# Patient Record
Sex: Female | Born: 1960 | Race: Black or African American | Hispanic: No | Marital: Single | Smoking: Never smoker
Health system: Southern US, Community
[De-identification: ages and names within clinical notes are randomized; demographics above are authoritative.]

## PROBLEM LIST (undated history)

## (undated) DIAGNOSIS — I10 Essential (primary) hypertension: Secondary | ICD-10-CM

## (undated) HISTORY — DX: Essential (primary) hypertension: I10

## (undated) HISTORY — DX: Morbid (severe) obesity due to excess calories: E66.01

---

## 2006-12-29 ENCOUNTER — Ambulatory Visit: Payer: Self-pay | Admitting: Emergency Medicine

## 2020-02-19 ENCOUNTER — Ambulatory Visit
Admission: EM | Admit: 2020-02-19 | Discharge: 2020-02-19 | Disposition: A | Discharge status: Home / Self Care | Payer: BC Managed Care – PPO | Attending: Emergency Medicine | Admitting: Emergency Medicine

## 2020-02-19 ENCOUNTER — Other Ambulatory Visit: Payer: Self-pay

## 2020-02-19 ENCOUNTER — Ambulatory Visit (INDEPENDENT_AMBULATORY_CARE_PROVIDER_SITE_OTHER): Payer: BC Managed Care – PPO

## 2020-02-19 DIAGNOSIS — R591 Generalized enlarged lymph nodes: Secondary | ICD-10-CM

## 2020-02-19 DIAGNOSIS — Z20822 Contact with and (suspected) exposure to covid-19: Secondary | ICD-10-CM | POA: Diagnosis not present

## 2020-02-19 LAB — COMPREHENSIVE METABOLIC PANEL
ALT: 20 U/L (ref 0–44)
AST: 27 U/L (ref 15–41)
Albumin: 3.5 g/dL (ref 3.5–5.0)
Alkaline Phosphatase: 104 U/L (ref 38–126)
Anion gap: 12 (ref 5–15)
BUN: 11 mg/dL (ref 6–20)
CO2: 27 mmol/L (ref 22–32)
Calcium: 9 mg/dL (ref 8.9–10.3)
Chloride: 100 mmol/L (ref 98–111)
Creatinine, Ser: 0.74 mg/dL (ref 0.44–1.00)
GFR calc Af Amer: >60 mL/min (ref >60)
GFR calc non Af Amer: >60 mL/min (ref >60)
Glucose, Bld: 96 mg/dL (ref 70–99)
Potassium: 4.1 mmol/L (ref 3.5–5.1)
Sodium: 139 mmol/L (ref 135–145)
Total Bilirubin: 0.7 mg/dL (ref 0.3–1.2)
Total Protein: 8.3 g/dL — ABNORMAL HIGH (ref 6.5–8.1)

## 2020-02-19 LAB — CBC WITH DIFFERENTIAL/PLATELET
Abs Immature Granulocytes: 0.05 10*3/uL (ref 0.00–0.07)
Basophils Absolute: 0.1 10*3/uL (ref 0.0–0.1)
Basophils Relative: 1 %
Eosinophils Absolute: 0 10*3/uL (ref 0.0–0.5)
Eosinophils Relative: 0 %
HCT: 39.9 % (ref 36.0–46.0)
Hemoglobin: 12.6 g/dL (ref 12.0–15.0)
Immature Granulocytes: 0 %
Lymphocytes Relative: 15 %
Lymphs Abs: 1.7 10*3/uL (ref 0.7–4.0)
MCH: 29.6 pg (ref 26.0–34.0)
MCHC: 31.6 g/dL (ref 30.0–36.0)
MCV: 93.7 fL (ref 80.0–100.0)
Monocytes Absolute: 1.4 10*3/uL — ABNORMAL HIGH (ref 0.1–1.0)
Monocytes Relative: 12 %
Neutro Abs: 8.4 10*3/uL — ABNORMAL HIGH (ref 1.7–7.7)
Neutrophils Relative %: 72 %
Platelets: 342 10*3/uL (ref 150–400)
RBC: 4.26 MIL/uL (ref 3.87–5.11)
RDW: 13.2 % (ref 11.5–15.5)
WBC: 11.6 10*3/uL — ABNORMAL HIGH (ref 4.0–10.5)
nRBC: 0 % (ref 0.0–0.2)

## 2020-02-19 NOTE — ED Provider Notes (Signed)
HPI  SUBJECTIVE:  Karen Welch is a 58 y.o. female who presents with 20 days of sore throat, nausea, fatigue, generalized weakness, dry cough, decreased appetite.  Symptoms started on 7/17.  States that she has had an unintentional 20 pound weight loss during this time.  She reports tender left axillary swollen lymphadenopathy, bilateral cervical and submental lymphadenopathy.  She reports some diarrhea that has mostly resolved and altered taste.  She denies fevers, night sweats, chest pain, shortness of breath, wheezing, abdominal pain, loss of sense of smell.  No dental pain.  She denies breast pain, nipple discharge.  States that she does regular breast self exams.  She got 2 doses of the Pfizer vaccine in May.  She has been taking cold medicine, Corcedrin and Tylenol without improvement in her symptoms.  Her fatigue is worse with exertion.  No antipyretic in the past 6 hours.  No blood transfusions, IV drug use, she has not been sexually active in "years".  No recent antibiotics, travel.  She has had 2 negative Covid PCR test and 1 negative Covid antibody test that came back today.  She is concerned that she has Covid.  She has a history of hypertension.  No history of breast cancer.  Had a normal mammogram 1 year ago.  No history of diabetes, HIV, multiple myeloma, cancer, smoking.  She had a colonoscopy in 2019 that found polyps.  Family history significant for mom, dad, sister, brother with lung cancer.  She states that they were all smokers.  TDH:RCBULAG, Lattie Haw, MD   Past Medical History:  Diagnosis Date  . Hypertension   . Morbid obesity ([place])     History reviewed. No pertinent surgical history.  Family History  Problem Relation Age of Onset  . COPD Mother   . Cancer Mother   . Cancer Father     Social History   Tobacco Use  . Smoking status: Never Smoker  . Smokeless tobacco: Never Used  Vaping Use  . Vaping Use: Never used  Substance Use Topics  . Alcohol use: Yes     Comment: ocassional glass of wine  . Drug use: Not Currently    No current facility-administered medications for this encounter.  Current Outpatient Medications:  .  furosemide (LASIX) 20 MG tablet, Take 1 tablet by mouth daily., Disp: , Rfl:  .  lisinopril (ZESTRIL) 40 MG tablet, Take by mouth., Disp: , Rfl:  .  valACYclovir (VALTREX) 500 MG tablet, Take by mouth., Disp: , Rfl:  .  ascorbic acid (VITAMIN C) 500 MG tablet, Take by mouth., Disp: , Rfl:  .  aspirin 81 MG EC tablet, Take by mouth., Disp: , Rfl:  .  Biotin 5 MG CAPS, Take by mouth., Disp: , Rfl:  .  cyanocobalamin 1000 MCG tablet, Take by mouth., Disp: , Rfl:  .  Magnesium (V-R MAGNESIUM) 250 MG TABS, Take by mouth., Disp: , Rfl:  .  Multiple Vitamins-Minerals ([employer] SPECTRAVITE ADULT 50+) CHEW, Chew by mouth., Disp: , Rfl:  .  zinc gluconate ([employer] ZINC) 50 MG tablet, Take by mouth., Disp: , Rfl:   No Known Allergies   ROS  As noted in HPI.   Physical Exam  BP 137/73 (BP Location: Right Arm)   Pulse 78   Temp 99.3 F (37.4 C) (Oral)   Resp 17   Ht 5' (1.524 m)   Wt 108.9 kg   SpO2 98%   BMI 46.87 kg/m   Constitutional: Well developed, well nourished, no  acute distress Eyes: PERRL, EOMI, conjunctiva normal bilaterally HENT: Normocephalic, atraumatic,mucus membranes moist.  Normal dentition.  Normal tonsils.  Normal TMs bilaterally.  Respiratory: Clear to auscultation bilaterally, no rales, no wheezing, no rhonchi Cardiovascular: Normal rate and rhythm, no murmurs, no gallops, no rubs GI: Soft, nondistended, normal bowel sounds, nontender, no rebound, no guarding.  No appreciable splenomegaly. Back: no CVAT  Breasts: Pendulous.  Symmetric.  No palpable masses. Lymph: Positive tender anterior bilateral cervical lymphadenopathy. Positive tender submental lymphadenopathy bilaterally.  No preauricular, postauricular lymphadenopathy.  Positive tender lymph node left axilla.  No supraclavicular, inguinal  lymphadenopathy skin: No rash, skin intact Musculoskeletal: No edema, no tenderness, no deformities Neurologic: Alert & oriented x 3, CN III-XII grossly intact, no motor deficits, sensation grossly intact Psychiatric: Speech and behavior appropriate   ED Course   Medications - No data to display  Orders Placed This Encounter  Procedures  . SARS CORONAVIRUS 2 (TAT 6-24 HRS) Nasopharyngeal Nasopharyngeal Swab    Standing Status:   Standing    Number of Occurrences:   1    Order Specific Question:   Is this test for diagnosis or screening    Answer:   Diagnosis of ill patient    Order Specific Question:   Symptomatic for COVID-19 as defined by CDC    Answer:   Yes    Order Specific Question:   Date of Symptom Onset    Answer:   01/31/2020    Order Specific Question:   Hospitalized for COVID-19    Answer:   No    Order Specific Question:   Admitted to ICU for COVID-19    Answer:   No    Order Specific Question:   Previously tested for COVID-19    Answer:   Yes    Order Specific Question:   Resident in a congregate (group) care setting    Answer:   No    Order Specific Question:   Employed in healthcare setting    Answer:   No    Order Specific Question:   Pregnant    Answer:   No    Order Specific Question:   Has patient completed COVID vaccination(s) (2 doses of Pfizer/Moderna 1 dose of [employer])    Answer:   Yes  . DG Chest 2 View    Standing Status:   Standing    Number of Occurrences:   1    Order Specific Question:   Reason for Exam (SYMPTOM  OR DIAGNOSIS REQUIRED)    Answer:   Cough 20 lb wt loss swollen LN r/o malignancy. not a smoker  . CBC with Differential    Standing Status:   Standing    Number of Occurrences:   1  . Comprehensive metabolic panel    Standing Status:   Standing    Number of Occurrences:   1   Results for orders placed or performed during the hospital encounter of 02/19/20 (from the past 24 hour(s))  CBC with Differential     Status:  Abnormal   Collection Time: 02/19/20  7:58 PM  Result Value Ref Range   WBC 11.6 (H) 4.0 - 10.5 K/uL   RBC 4.26 3.87 - 5.11 MIL/uL   Hemoglobin 12.6 12.0 - 15.0 g/dL   HCT 39.9 36 - 46 %   MCV 93.7 80.0 - 100.0 fL   MCH 29.6 26.0 - 34.0 pg   MCHC 31.6 30.0 - 36.0 g/dL   RDW 13.2 11.5 -  15.5 %   Platelets 342 150 - 400 K/uL   nRBC 0.0 0.0 - 0.2 %   Neutrophils Relative % 72 %   Neutro Abs 8.4 (H) 1.7 - 7.7 K/uL   Lymphocytes Relative 15 %   Lymphs Abs 1.7 0.7 - 4.0 K/uL   Monocytes Relative 12 %   Monocytes Absolute 1.4 (H) 0 - 1 K/uL   Eosinophils Relative 0 %   Eosinophils Absolute 0.0 0 - 0 K/uL   Basophils Relative 1 %   Basophils Absolute 0.1 0 - 0 K/uL   Immature Granulocytes 0 %   Abs Immature Granulocytes 0.05 0.00 - 0.07 K/uL  Comprehensive metabolic panel     Status: Abnormal   Collection Time: 02/19/20  7:58 PM  Result Value Ref Range   Sodium 139 135 - 145 mmol/L   Potassium 4.1 3.5 - 5.1 mmol/L   Chloride 100 98 - 111 mmol/L   CO2 27 22 - 32 mmol/L   Glucose, Bld 96 70 - 99 mg/dL   BUN 11 6 - 20 mg/dL   Creatinine, Ser 0.74 0.44 - 1.00 mg/dL   Calcium 9.0 8.9 - 10.3 mg/dL   Total Protein 8.3 (H) 6.5 - 8.1 g/dL   Albumin 3.5 3.5 - 5.0 g/dL   AST 27 15 - 41 U/L   ALT 20 0 - 44 U/L   Alkaline Phosphatase 104 38 - 126 U/L   Total Bilirubin 0.7 0.3 - 1.2 mg/dL   GFR calc non Af Amer >60 >60 mL/min   GFR calc Af Amer >60 >60 mL/min   Anion gap 12 5 - 15   DG Chest 2 View  Result Date: 02/19/2020 CLINICAL DATA:  Cough and weight loss EXAM: CHEST - 2 VIEW COMPARISON:  None. FINDINGS: The heart size and mediastinal contours are within normal limits. Both lungs are clear. The visualized skeletal structures are unremarkable. IMPRESSION: No active cardiopulmonary disease. Electronically Signed   By: Ulyses Jarred M.D.   On: 02/19/2020 20:21    ED Clinical Impression  1. Lymphadenopathy   2. Encounter for laboratory testing for COVID-19 virus      ED  Assessment/Plan  Given the swollen lymph nodes and 20 pound weight loss, malignancy in the differential.  Also in the differential is a viral illness such as mono.  She has no evidence of a dental or pharyngeal infection to explain her submental and neck lymphadenopathy.   will repeat Covid test per patient request.  Obtaining chest x-ray, CBC, CMP.  Considered HIV but in the absence of risk factors, deferred this today.  Reviewed imaging independently.  Normal chest x-ray. See radiology report for full details.  Mild leukocytosis.  Normal chest x-ray.  Normal CMP.  Unsure as to the cause of her symptoms but given the tender lymphadenopathy favor viral cause. will have her follow-up with her primary care if this lymphadenopathy does not resolve in a week or so.  May need to be evaluated by heme-onc for LN biopsy.  Discussed labs, imaging, MDM, treatment plan, and plan for follow-up with patient Discussed sn/sx that should prompt return to the ED. patient agrees with plan.   No orders of the defined types were placed in this encounter.   *This clinic note was created using Dragon dictation software. Therefore, there may be occasional mistakes despite careful proofreading.  ?    Melynda Ripple, MD 02/20/20 1344

## 2020-02-19 NOTE — Discharge Instructions (Addendum)
Your labs were significant for mildly elevated white count.  Everything else including your chest x-ray was normal.  I suspect that this may be a viral cause.  We will have your Covid test results back in 24 hours.  We will contact you only if they are positive.  Follow-up with your doctor if you are not better in 10 days.  You may need to be seen by heme-onc.  In the meantime, may take Tylenol/ibuprofen together 3-4 times a day as needed for pain.

## 2020-02-19 NOTE — ED Triage Notes (Addendum)
Pt states she may have been exposed to COVID at a car dealership on 7/13.  On 7/17 she started with runny nose, diarrhea, decreased appetite and noticed swollen lymph nodes in her neck and under her arm at time. Feels fatigued. Has had 2 negative COVID tests and antibody testing. Most recent test came back yesterday and was negative. Pt has never run a fever or lost her taste or smell. Pt is fully vaccinated.

## 2020-02-20 LAB — SARS CORONAVIRUS 2 (TAT 6-24 HRS): SARS Coronavirus 2: NEGATIVE

## 2020-12-15 IMAGING — CR DG CHEST 2V
2 series · 2 of 2 positions shown · non-contrast
Comparison: None.

CLINICAL DATA: Cough and weight loss

EXAM:
CHEST - 2 VIEW

[chest pa]
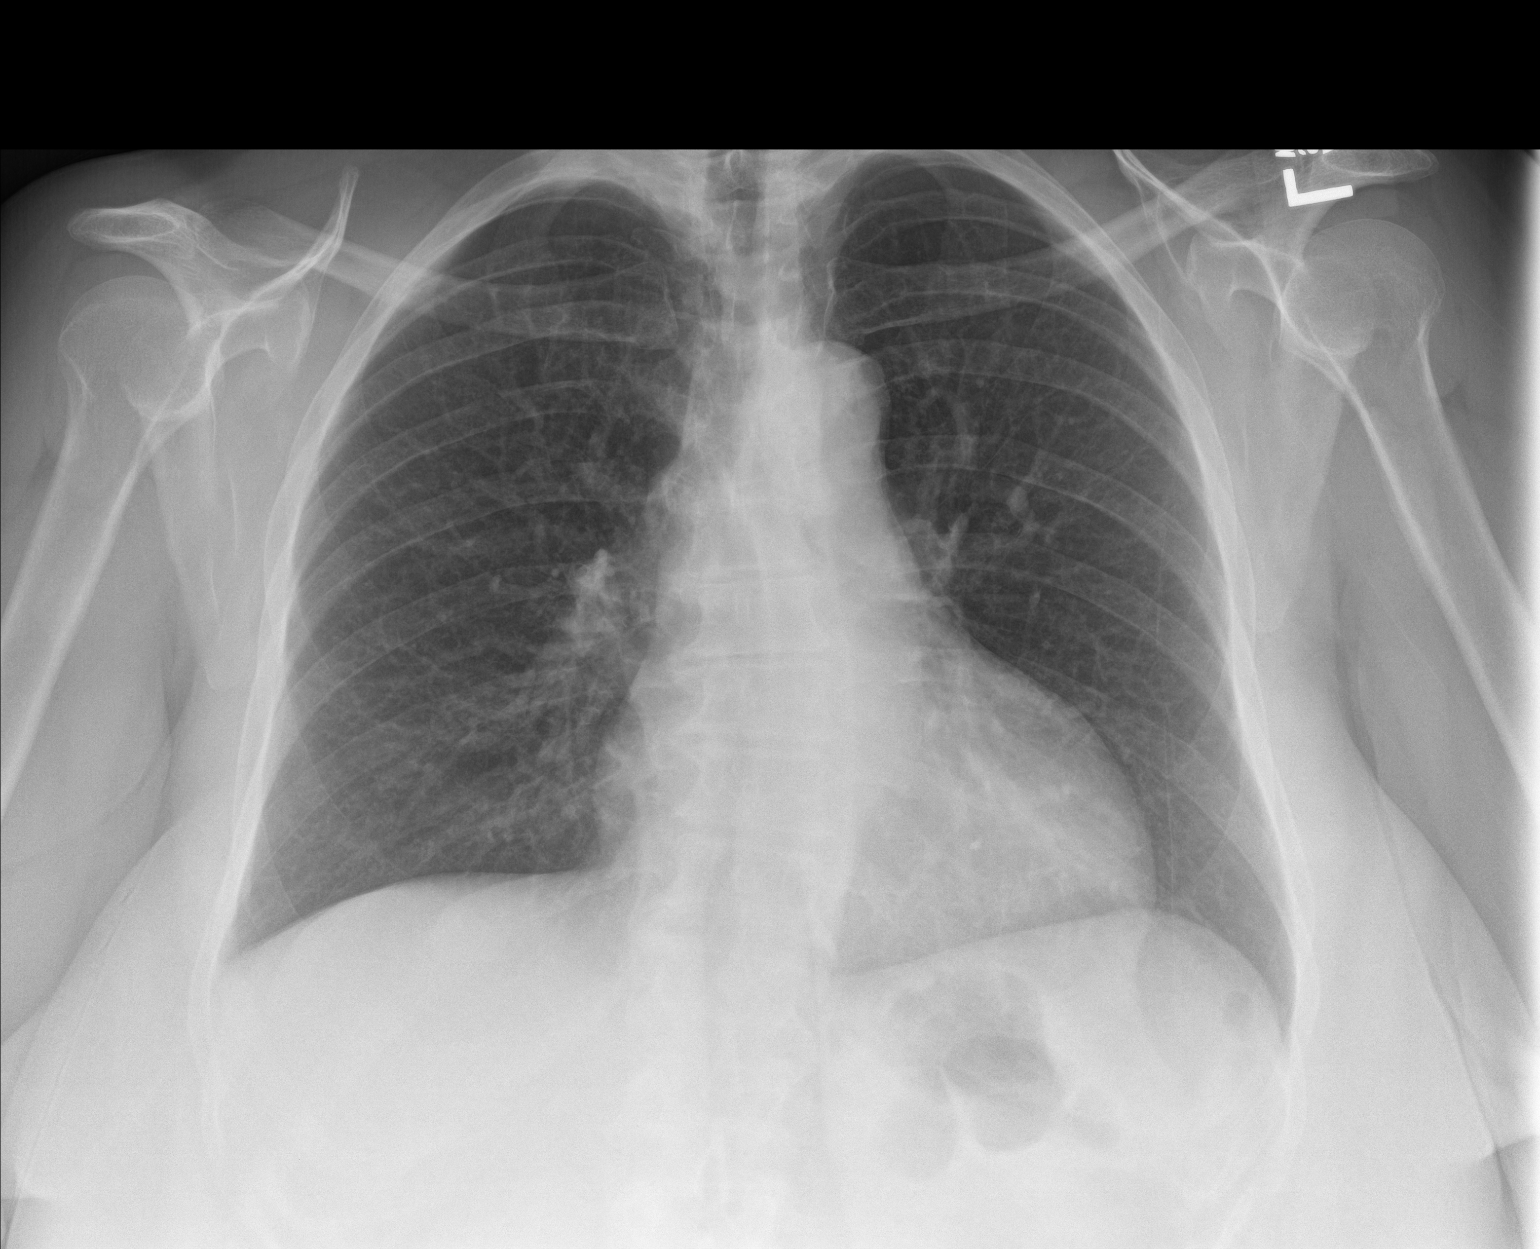

[chest lat]
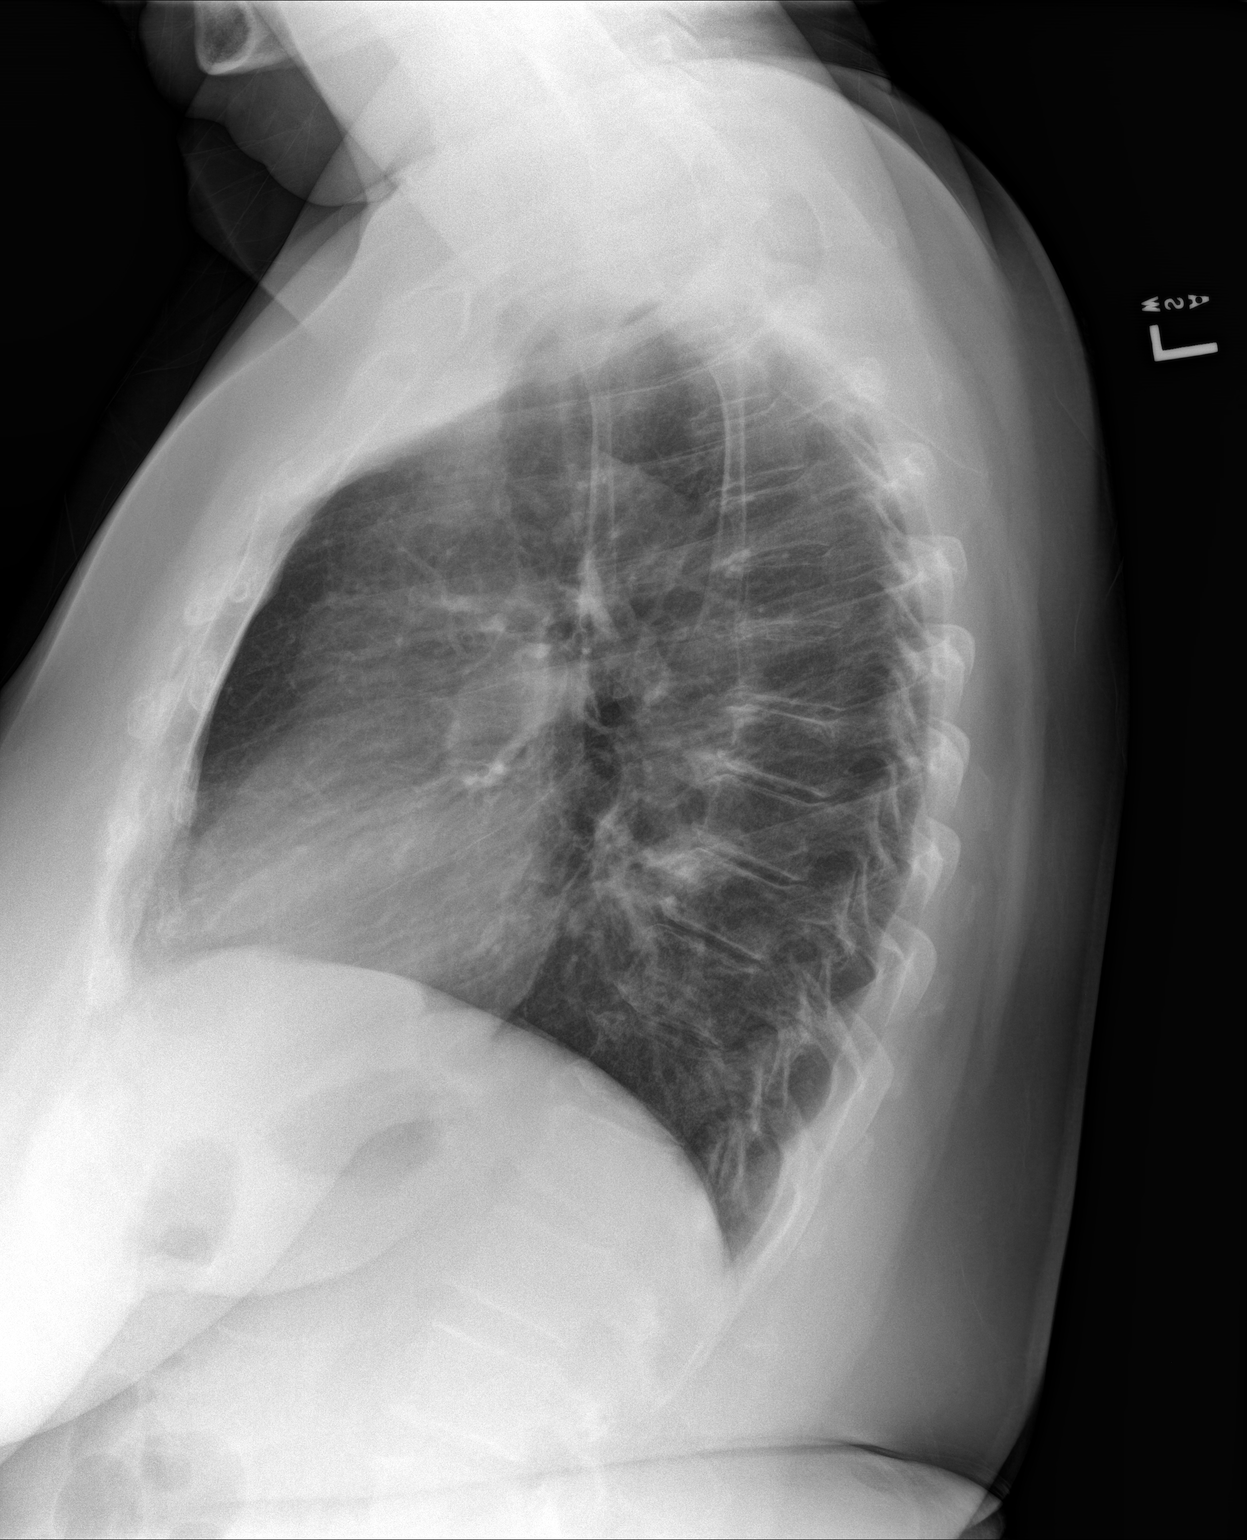

[2 of 2 positions shown; findings below may reference images not displayed]

FINDINGS: The heart size and mediastinal contours are within normal limits.
Both lungs are clear. The visualized skeletal structures are
unremarkable.
IMPRESSION: No active cardiopulmonary disease.
